# Patient Record
Sex: Male | Born: 1970 | Race: Black or African American | Hispanic: No | Smoking: Current every day smoker
Health system: Southern US, Community
[De-identification: ages and names within clinical notes are randomized; demographics above are authoritative.]

---

## 2018-07-17 ENCOUNTER — Other Ambulatory Visit: Payer: Self-pay

## 2018-07-17 ENCOUNTER — Encounter: Payer: Self-pay | Admitting: Emergency Medicine

## 2018-07-17 ENCOUNTER — Emergency Department
Admission: EM | Admit: 2018-07-17 | Discharge: 2018-07-18 | Disposition: A | Discharge status: Home / Self Care | Payer: Self-pay | Attending: Emergency Medicine | Admitting: Emergency Medicine

## 2018-07-17 DIAGNOSIS — J189 Pneumonia, unspecified organism: Secondary | ICD-10-CM | POA: Insufficient documentation

## 2018-07-17 DIAGNOSIS — R1032 Left lower quadrant pain: Secondary | ICD-10-CM | POA: Insufficient documentation

## 2018-07-17 DIAGNOSIS — R109 Unspecified abdominal pain: Secondary | ICD-10-CM

## 2018-07-17 DIAGNOSIS — F17210 Nicotine dependence, cigarettes, uncomplicated: Secondary | ICD-10-CM | POA: Insufficient documentation

## 2018-07-17 MED ORDER — SODIUM CHLORIDE 0.9 % IV BOLUS
1000.0000 mL | Freq: Once | INTRAVENOUS | Status: AC
  Administered 2018-07-18: 1000 mL via INTRAVENOUS

## 2018-07-17 NOTE — ED Triage Notes (Signed)
Pt reports left flank pain for past 3 days reports today pain is constant "feels like a cramp" reports having difficulty voiding, pt denies any nausea, vomiting or diarrhea. Pt talks in complete sentences no respiratory distress noted

## 2018-07-17 NOTE — ED Provider Notes (Signed)
[facility] Emergency Department Provider Note   ____________________________________________   First MD Initiated Contact with Patient 07/17/18 2347     (approximate)  I have reviewed the triage vital signs and the nursing notes.   HISTORY  Chief Complaint Flank Pain (left side )    HPI Bradley Kelly is a 48 y.o. male who presents to the ED from home with a chief complaint of left flank pain.  Patient reports a 3-day history of waxing and waning left-sided flank pain which he describes as feeling like a "cramp".  Associated with difficulty voiding and dark looking urine.  Also endorses cough x3 days.  Fever today to 100.5 F.  Denies associated chest pain, shortness of breath, nausea, vomiting, diarrhea.  Denies testicular pain or swelling.  Denies recent travel or trauma.       Past medical history None  There are no active problems to display for this patient.   History reviewed. No pertinent surgical history.  Prior to Admission medications   Medication Sig Start Date End Date Taking? Authorizing Provider  azithromycin (ZITHROMAX) 250 MG tablet Take 1 tablet (250 mg total) by mouth daily. 07/18/18   Irean Hong, MD  cephALEXin (KEFLEX) 500 MG capsule Take 1 capsule (500 mg total) by mouth 3 (three) times daily. 07/18/18   Irean Hong, MD    Allergies Patient has no allergy information on record.  No family history on file.  Social History Social History   Tobacco Use   Smoking status: Current Every Day Smoker    Packs/day: 1.00    Types: Cigarettes  Substance Use Topics   Alcohol use: Not Currently   Drug use: Not Currently    Review of Systems  Constitutional: Positive for fever. Eyes: No visual changes. ENT: No sore throat. Cardiovascular: Denies chest pain. Respiratory: Positive for cough.  Denies shortness of breath. Gastrointestinal: Positive for left flank pain.  No abdominal pain.  No nausea, no vomiting.  No diarrhea.   No constipation. Genitourinary: Negative for dysuria. Musculoskeletal: Negative for back pain. Skin: Negative for rash. Neurological: Negative for headaches, focal weakness or numbness.   ____________________________________________   PHYSICAL EXAM:  VITAL SIGNS: ED Triage Vitals [07/17/18 2313]  Enc Vitals Group     BP 114/68     Pulse Rate 93     Resp 20     Temp 98.2 F (36.8 C)     Temp Source Oral     SpO2 95 %     Weight 160 lb (72.6 kg)     Height 5\' 11"  (1.803 m)     Head Circumference      Peak Flow      Pain Score 8     Pain Loc      Pain Edu?      Excl. in GC?     Constitutional: Alert and oriented. Well appearing and in no acute distress. Eyes: Conjunctivae are normal. PERRL. EOMI. Head: Atraumatic. Nose: No congestion/rhinnorhea. Mouth/Throat: Mucous membranes are moist.  Oropharynx non-erythematous. Neck: No stridor.   Cardiovascular: Normal rate, regular rhythm. Grossly normal heart sounds.  Good peripheral circulation. Respiratory: Normal respiratory effort.  No retractions. Lungs CTAB.  Loose sounding cough. Gastrointestinal: Soft and nontender. No distention. No abdominal bruits.  Mild left CVA tenderness. Musculoskeletal: No lower extremity tenderness nor edema.  No joint effusions. Neurologic:  Normal speech and language. No gross focal neurologic deficits are appreciated. No gait instability. Skin:  Skin is warm,  dry and intact. No rash noted. Psychiatric: Mood and affect are normal. Speech and behavior are normal.  ____________________________________________   LABS (all labs ordered are listed, but only abnormal results are displayed)  Labs Reviewed  URINALYSIS, COMPLETE (UACMP) WITH MICROSCOPIC - Abnormal; Notable for the following components:      Result Value   Color, Urine YELLOW (*)    APPearance CLEAR (*)    Hgb urine dipstick SMALL (*)    All other components within normal limits  CBC WITH DIFFERENTIAL/PLATELET - Abnormal;  Notable for the following components:   WBC 13.2 (*)    Neutro Abs 11.0 (*)    All other components within normal limits  BASIC METABOLIC PANEL - Abnormal; Notable for the following components:   Glucose, Bld 113 (*)    BUN 29 (*)    Calcium 8.7 (*)    All other components within normal limits   ____________________________________________  EKG  None ____________________________________________  RADIOLOGY  ED MD interpretation: CT demonstrates no nephrolithiasis; chest x-ray demonstrates probable left basilar infiltrate  Official radiology report(s): Dg Chest 2 View  Result Date: 07/18/2018 CLINICAL DATA:  Initial evaluation for acute cough. Left flank pain. EXAM: CHEST - 2 VIEW COMPARISON:  None available. FINDINGS: Cardiac and mediastinal silhouettes are within normal limits. Lungs normally inflated. Patchy linear left basilar opacity could reflect atelectasis or infiltrate. Mild streaky right basilar atelectasis. Suspected trace left pleural effusion. No pulmonary edema. No pneumothorax. No acute osseous finding. IMPRESSION: 1. Patchy and linear left basilar opacity, which could reflect atelectasis or possibly small infiltrate. 2. Suspected trace left pleural effusion. Electronically Signed   By: Rise Mu M.D.   On: 07/18/2018 00:43   Ct Renal Stone Study  Result Date: 07/18/2018 CLINICAL DATA:  Initial evaluation for acute left flank pain, cough. EXAM: CT ABDOMEN AND PELVIS WITHOUT CONTRAST TECHNIQUE: Multidetector CT imaging of the abdomen and pelvis was performed following the standard protocol without IV contrast. COMPARISON:  None available. FINDINGS: Lower chest: Small layering left pleural effusion. Associated patchy consolidative left basilar opacity. Mild scattered subsegmental atelectasis at the right lung base. Visualized lungs are otherwise clear. Hepatobiliary: Limited noncontrast evaluation of the liver is unremarkable. Gallbladder within normal limits. No  biliary dilatation. Pancreas: Pancreas within normal limits. Spleen: Spleen within normal limits. Adrenals/Urinary Tract: Adrenal glands within normal limits. Kidneys equal in size without evidence for nephrolithiasis or hydronephrosis. No radiopaque calculi seen along the course of either renal collecting system. No hydroureter. Bladder largely decompressed without acute finding. No layering stones within the bladder lumen. Stomach/Bowel: Stomach within normal limits. No evidence for bowel obstruction. Normal appendix. Gaseous distension of the rectal vault noted. No acute inflammatory changes seen about the bowels. Vascular/Lymphatic: Intra-abdominal aorta of normal caliber. Mild aorto bi-iliac atherosclerotic disease. No adenopathy. Reproductive: Prostate within normal limits. Other: No free air or fluid. Musculoskeletal: No acute osseous abnormality. No discrete lytic or blastic osseous lesions. Small benign bone island noted within the left iliac wing. IMPRESSION: 1. Small layering left pleural effusion with associated left basilar consolidation. While this finding may in part reflect atelectasis, possible infiltrate could be considered in the correct clinical setting. 2. No CT evidence for nephrolithiasis, ureterolithiasis, or obstructive uropathy. 3. No other acute intra-abdominal or pelvic process. Electronically Signed   By: Rise Mu M.D.   On: 07/18/2018 00:58    ____________________________________________   PROCEDURES  Procedure(s) performed (including Critical Care):  Procedures   ____________________________________________   INITIAL IMPRESSION / ASSESSMENT AND PLAN /  ED COURSE  As part of my medical decision making, I reviewed the following data within the electronic MEDICAL RECORD NUMBER Nursing notes reviewed and incorporated, Labs reviewed, Old chart reviewed, Radiograph reviewed and Notes from prior ED visits        48 year old male who presents with left flank pain.  Differential diagnosis includes, but is not limited to, acute appendicitis, renal colic, testicular torsion, urinary tract infection/pyelonephritis, prostatitis,  epididymitis, diverticulitis, small bowel obstruction or ileus, colitis, abdominal aortic aneurysm, gastroenteritis, hernia, etc.  Will obtain basic lab work, urinalysis.  Initiate IV fluid resuscitation, IV Dilaudid for pain paired with IV Zofran for nausea.  Will obtain CT renal stone study to evaluate for ureteral colic.  Will also obtain chest x-ray given fever and cough in the setting of coronavirus pandemic.   Clinical Course as of Jul 17 137  Sun Jul 18, 2018  0134 Updated patient on x-ray and CT results.  Will treat for community-acquired pneumonia with Keflex and azithromycin.  Strict return precautions given.  Patient verbalizes understanding agrees with plan of care.   [JS]    Clinical Course User Index [JS] Irean Hong, MD     ____________________________________________   FINAL CLINICAL IMPRESSION(S) / ED DIAGNOSES  Final diagnoses:  Left flank pain  Community acquired pneumonia of left lung, unspecified part of lung     ED Discharge Orders         Ordered    cephALEXin (KEFLEX) 500 MG capsule  3 times daily     07/18/18 0137    azithromycin (ZITHROMAX) 250 MG tablet  Daily     07/18/18 0137           Note:  This document was prepared using Dragon voice recognition software and may include unintentional dictation errors.   Irean Hong, MD 07/18/18 [phone]

## 2018-07-18 ENCOUNTER — Emergency Department: Payer: Self-pay

## 2018-07-18 LAB — URINALYSIS, COMPLETE (UACMP) WITH MICROSCOPIC
Bacteria, UA: NONE SEEN
Bilirubin Urine: NEGATIVE
Glucose, UA: NEGATIVE mg/dL
Ketones, ur: NEGATIVE mg/dL
Leukocytes,Ua: NEGATIVE
Nitrite: NEGATIVE
Protein, ur: NEGATIVE mg/dL
Specific Gravity, Urine: 1.029 (ref 1.005–1.030)
pH: 5 (ref 5.0–8.0)

## 2018-07-18 LAB — CBC WITH DIFFERENTIAL/PLATELET
Abs Immature Granulocytes: 0.05 10*3/uL (ref 0.00–0.07)
Basophils Absolute: 0 10*3/uL (ref 0.0–0.1)
Basophils Relative: 0 %
EOS ABS: 0.1 10*3/uL (ref 0.0–0.5)
EOS PCT: 1 %
HCT: 45.7 % (ref 39.0–52.0)
Hemoglobin: 15.5 g/dL (ref 13.0–17.0)
Immature Granulocytes: 0 %
Lymphocytes Relative: 10 %
Lymphs Abs: 1.3 10*3/uL (ref 0.7–4.0)
MCH: 30.3 pg (ref 26.0–34.0)
MCHC: 33.9 g/dL (ref 30.0–36.0)
MCV: 89.4 fL (ref 80.0–100.0)
Monocytes Absolute: 0.7 10*3/uL (ref 0.1–1.0)
Monocytes Relative: 5 %
Neutro Abs: 11 10*3/uL — ABNORMAL HIGH (ref 1.7–7.7)
Neutrophils Relative %: 84 %
PLATELETS: 264 10*3/uL (ref 150–400)
RBC: 5.11 MIL/uL (ref 4.22–5.81)
RDW: 12 % (ref 11.5–15.5)
WBC: 13.2 10*3/uL — AB (ref 4.0–10.5)
nRBC: 0 % (ref 0.0–0.2)

## 2018-07-18 LAB — BASIC METABOLIC PANEL
Anion gap: 9 (ref 5–15)
BUN: 29 mg/dL — ABNORMAL HIGH (ref 6–20)
CO2: 26 mmol/L (ref 22–32)
CREATININE: 1.16 mg/dL (ref 0.61–1.24)
Calcium: 8.7 mg/dL — ABNORMAL LOW (ref 8.9–10.3)
Chloride: 104 mmol/L (ref 98–111)
GFR calc Af Amer: >60 mL/min (ref >60)
GFR calc non Af Amer: >60 mL/min (ref >60)
Glucose, Bld: 113 mg/dL — ABNORMAL HIGH (ref 70–99)
Potassium: 3.9 mmol/L (ref 3.5–5.1)
Sodium: 139 mmol/L (ref 135–145)

## 2018-07-18 MED ORDER — AZITHROMYCIN 250 MG PO TABS: 250.0000 mg | ORAL_TABLET | Freq: Every day | ORAL | 0 refills | Status: AC

## 2018-07-18 MED ORDER — AZITHROMYCIN 500 MG PO TABS
500.0000 mg | ORAL_TABLET | Freq: Once | ORAL | Status: AC
  Administered 2018-07-18: 500 mg via ORAL
  Filled 2018-07-18: qty 1

## 2018-07-18 MED ORDER — ACETAMINOPHEN 500 MG PO TABS
1000.0000 mg | ORAL_TABLET | Freq: Once | ORAL | Status: AC
  Administered 2018-07-18: 1000 mg via ORAL

## 2018-07-18 MED ORDER — ACETAMINOPHEN 500 MG PO TABS
ORAL_TABLET | ORAL | Status: AC
  Filled 2018-07-18: qty 2

## 2018-07-18 MED ORDER — ONDANSETRON HCL 4 MG/2ML IJ SOLN
4.0000 mg | Freq: Once | INTRAMUSCULAR | Status: AC
  Administered 2018-07-18: 4 mg via INTRAVENOUS
  Filled 2018-07-18: qty 2

## 2018-07-18 MED ORDER — HYDROMORPHONE HCL 1 MG/ML IJ SOLN
0.5000 mg | Freq: Once | INTRAMUSCULAR | Status: AC
  Administered 2018-07-18: 0.5 mg via INTRAVENOUS
  Filled 2018-07-18: qty 1

## 2018-07-18 MED ORDER — CEPHALEXIN 500 MG PO CAPS
500.0000 mg | ORAL_CAPSULE | Freq: Once | ORAL | Status: AC
  Administered 2018-07-18: 500 mg via ORAL
  Filled 2018-07-18: qty 1

## 2018-07-18 MED ORDER — CEPHALEXIN 500 MG PO CAPS: 500.0000 mg | ORAL_CAPSULE | Freq: Three times a day (TID) | ORAL | 0 refills | Status: AC

## 2018-07-18 MED ORDER — HYDROCOD POLST-CPM POLST ER 10-8 MG/5ML PO SUER: 5.0000 mL | Freq: Two times a day (BID) | ORAL | 0 refills | Status: AC

## 2018-07-18 NOTE — Discharge Instructions (Addendum)
Take antibiotics as prescribed: Keflex 500mg  three times daily x 7 days Azithromycin 250mg  daily x 4 days 2.  You may take Tussionex as needed for cough. 3.  Return to the ER for worsening symptoms, persistent vomiting, difficulty breathing or other concerns.

## 2018-07-18 NOTE — ED Notes (Signed)
Pt taken to CT.

## 2018-07-18 NOTE — ED Notes (Addendum)
Paper copy of dispo signature d/t topaz not working  Pt refused wheelchair to lobby

## 2020-11-19 IMAGING — CT CT RENAL STONE PROTOCOL
2 of 4 series · 16 of 46 positions shown, 18 images · non-contrast
Comparison: None available.

CLINICAL DATA: Initial evaluation for acute left flank pain, cough.

EXAM:
CT ABDOMEN AND PELVIS WITHOUT CONTRAST
TECHNIQUE: Multidetector CT imaging of the abdomen and pelvis was performed
following the standard protocol without IV contrast.

[Series 2: stone full standard · axial · 0.65mm/px · z∈[-1008,-593]mm · 13 of 91 slices shown, 15 images]
[im 4/91  soft-tissue]
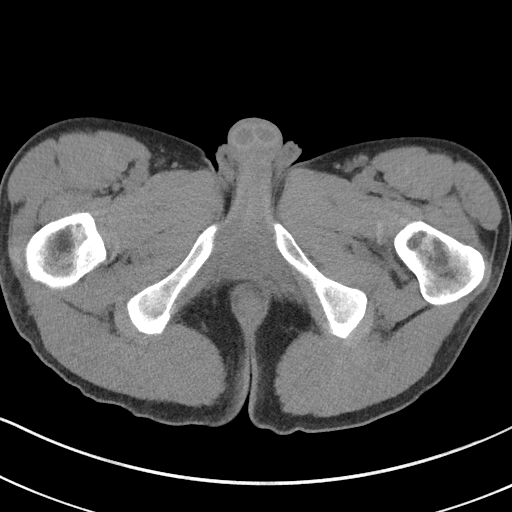
[im 4/91  bone]
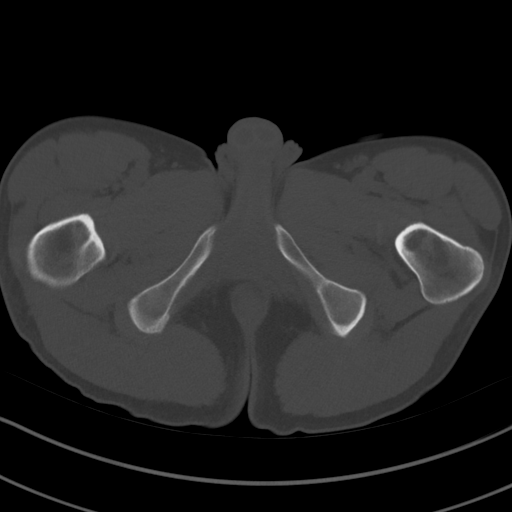
[im 11/91  soft-tissue]
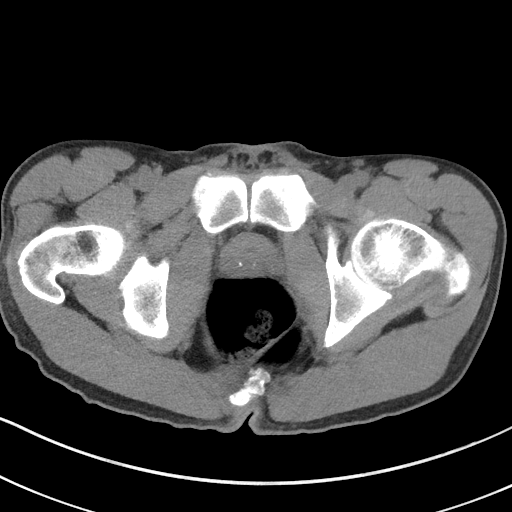
[im 19/91  soft-tissue]
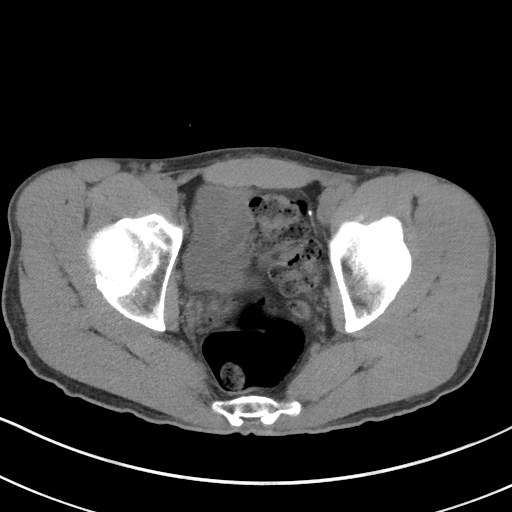
[im 26/91  soft-tissue]
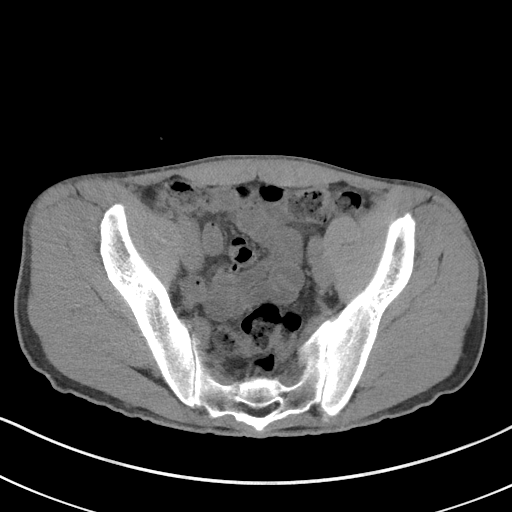
[im 33/91  soft-tissue]
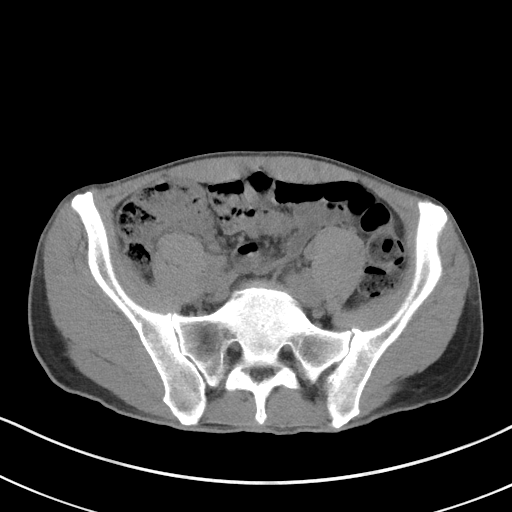
[im 40/91  soft-tissue]
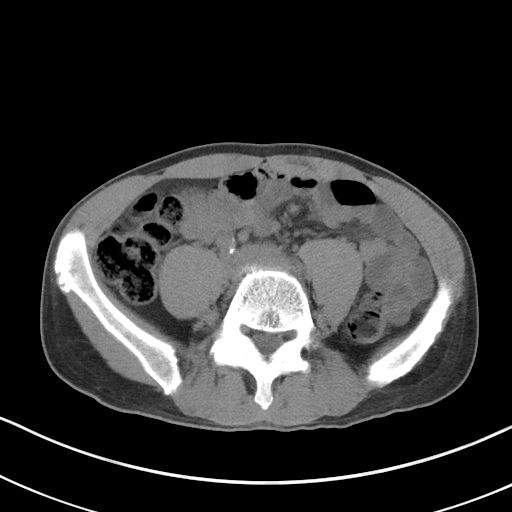
[im 47/91  soft-tissue]
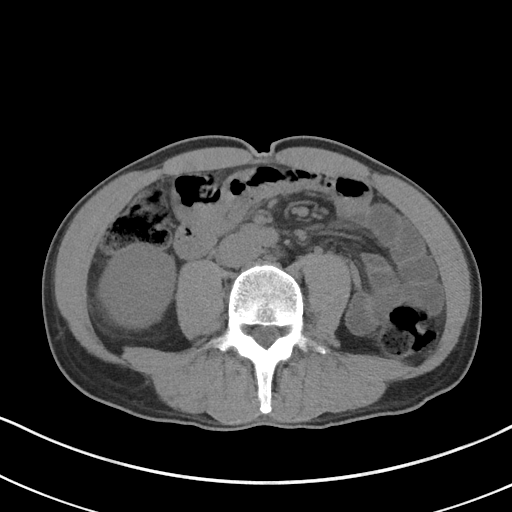
[im 51/91  soft-tissue]
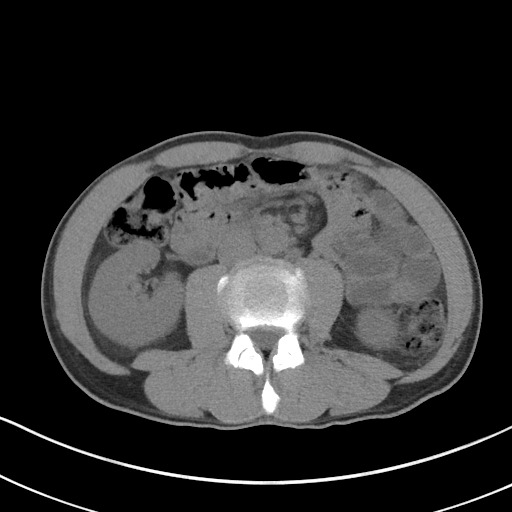
[im 58/91  soft-tissue]
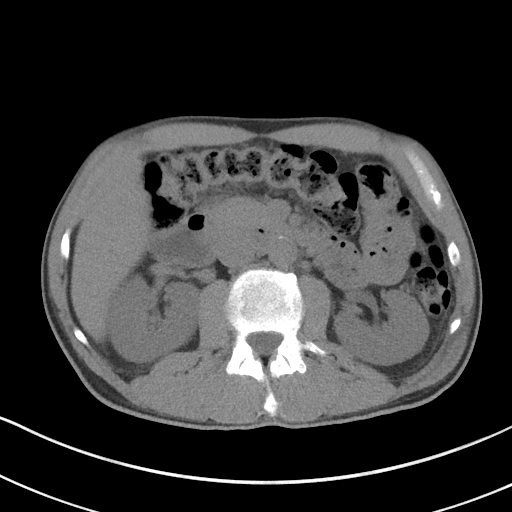
[im 58/91  bone]
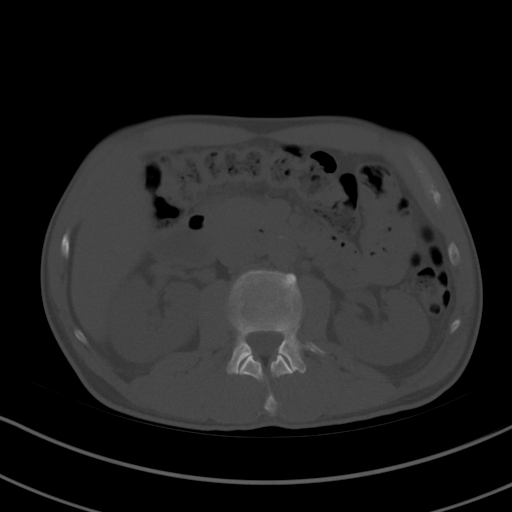
[im 65/91  soft-tissue]
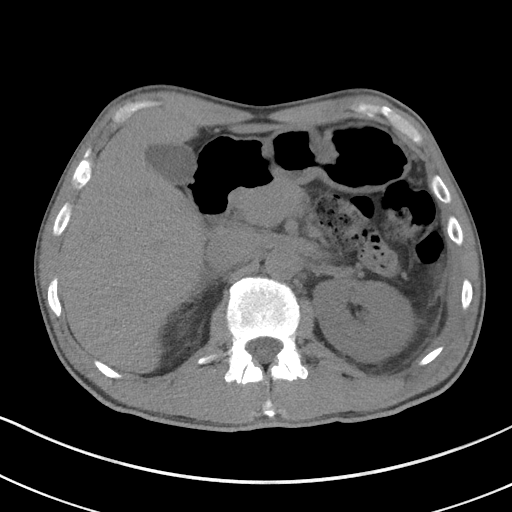
[im 73/91  soft-tissue]
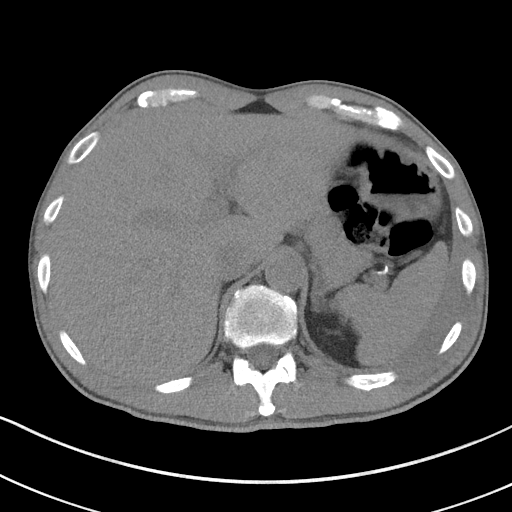
[im 80/91  soft-tissue]
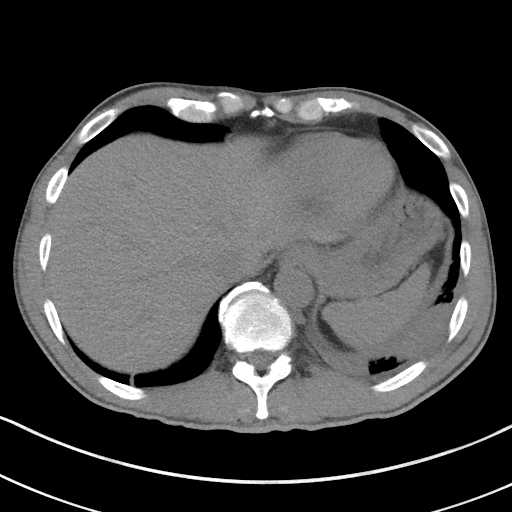
[im 87/91  soft-tissue]
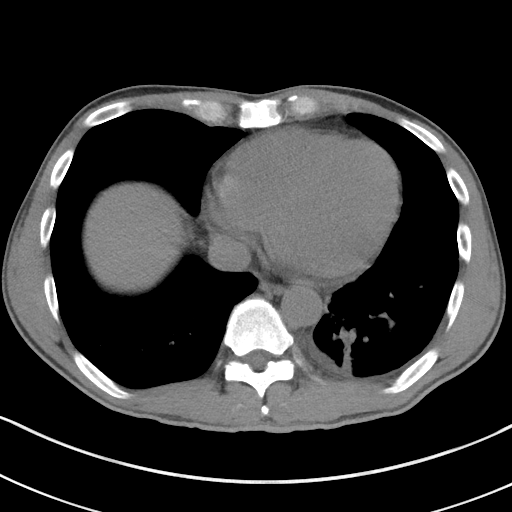

[Series 5: coronal · coronal · 0.66mm/px · 3 of 115 slices shown]
[im 39/115  soft-tissue]
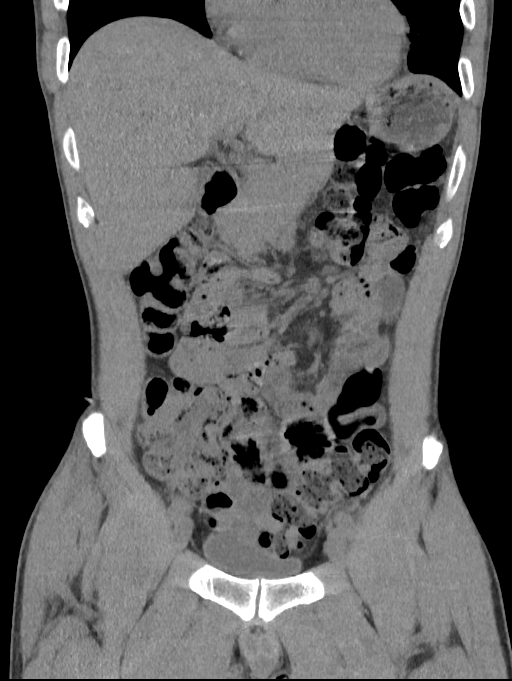
[im 51/115  soft-tissue]
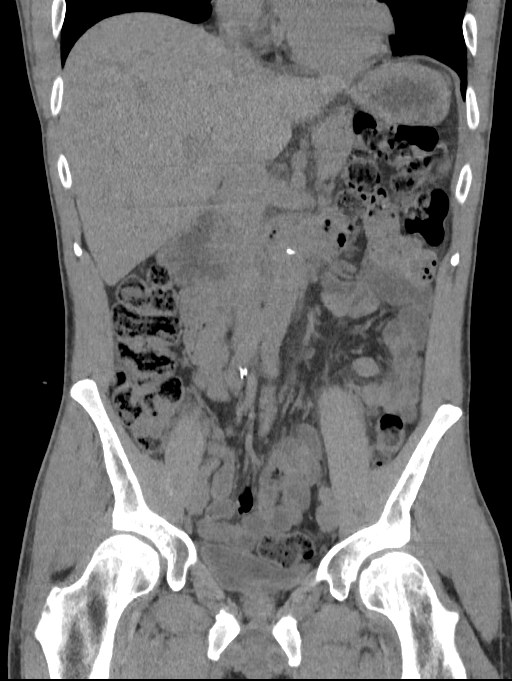
[im 64/115  soft-tissue]
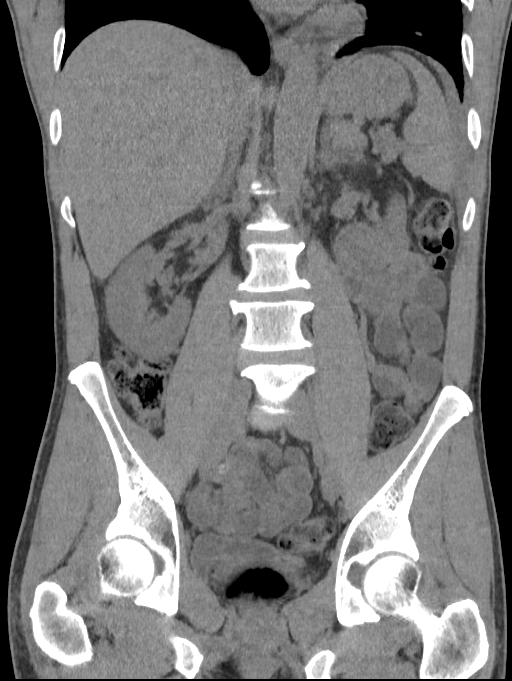

[16 of 46 positions shown; findings below may reference images not displayed]

FINDINGS: Lower chest: Small layering left pleural effusion. Associated patchy
consolidative left basilar opacity. Mild scattered subsegmental
atelectasis at the right lung base. Visualized lungs are otherwise
clear.

Hepatobiliary: Limited noncontrast evaluation of the liver is
unremarkable. Gallbladder within normal limits. No biliary
dilatation.

Pancreas: Pancreas within normal limits.

Spleen: Spleen within normal limits.

Adrenals/Urinary Tract: Adrenal glands within normal limits. Kidneys
equal in size without evidence for nephrolithiasis or
hydronephrosis. No radiopaque calculi seen along the course of
either renal collecting system. No hydroureter. Bladder largely
decompressed without acute finding. No layering stones within the
bladder lumen.

Stomach/Bowel: Stomach within normal limits. No evidence for bowel
obstruction. Normal appendix. Gaseous distension of the rectal vault
noted. No acute inflammatory changes seen about the bowels.

Vascular/Lymphatic: Intra-abdominal aorta of normal caliber. Mild
aorto bi-iliac atherosclerotic disease. No adenopathy.

Reproductive: Prostate within normal limits.

Other: No free air or fluid.

Musculoskeletal: No acute osseous abnormality. No discrete lytic or
blastic osseous lesions. Small benign bone island noted within the
left iliac wing.
IMPRESSION: 1. Small layering left pleural effusion with associated left basilar
consolidation. While this finding may in part reflect atelectasis,
possible infiltrate could be considered in the correct clinical
setting.
2. No CT evidence for nephrolithiasis, ureterolithiasis, or
obstructive uropathy.
3. No other acute intra-abdominal or pelvic process.
